# Patient Record
Sex: Male | Born: 1975 | Race: Black or African American | Hispanic: No | Marital: Single | Smoking: Former smoker
Health system: Southern US, Community
[De-identification: ages and names within clinical notes are randomized; demographics above are authoritative.]

## PROBLEM LIST (undated history)

## (undated) DIAGNOSIS — J45909 Unspecified asthma, uncomplicated: Secondary | ICD-10-CM

## (undated) HISTORY — PX: HERNIA REPAIR: SHX51

---

## 1998-02-27 ENCOUNTER — Inpatient Hospital Stay (HOSPITAL_COMMUNITY): Admission: EM | Admit: 1998-02-27 | Discharge: 1998-02-28 | Payer: Self-pay | Admitting: Emergency Medicine

## 1999-02-03 ENCOUNTER — Encounter: Payer: Self-pay | Admitting: Emergency Medicine

## 1999-02-03 ENCOUNTER — Emergency Department (HOSPITAL_COMMUNITY): Admission: EM | Admit: 1999-02-03 | Discharge: 1999-02-03 | Payer: Self-pay | Admitting: Emergency Medicine

## 2001-06-03 ENCOUNTER — Emergency Department (HOSPITAL_COMMUNITY): Admission: EM | Admit: 2001-06-03 | Discharge: 2001-06-03 | Payer: Self-pay | Admitting: Emergency Medicine

## 2002-02-25 ENCOUNTER — Emergency Department (HOSPITAL_COMMUNITY): Admission: EM | Admit: 2002-02-25 | Discharge: 2002-02-25 | Payer: Self-pay | Admitting: Emergency Medicine

## 2002-02-25 ENCOUNTER — Encounter: Payer: Self-pay | Admitting: Emergency Medicine

## 2002-03-02 ENCOUNTER — Emergency Department (HOSPITAL_COMMUNITY): Admission: EM | Admit: 2002-03-02 | Discharge: 2002-03-02 | Payer: Self-pay

## 2003-08-29 ENCOUNTER — Emergency Department (HOSPITAL_COMMUNITY): Admission: EM | Admit: 2003-08-29 | Discharge: 2003-08-29 | Payer: Self-pay | Admitting: Emergency Medicine

## 2006-08-05 ENCOUNTER — Emergency Department (HOSPITAL_COMMUNITY): Admission: EM | Admit: 2006-08-05 | Discharge: 2006-08-06 | Payer: Self-pay | Admitting: *Deleted

## 2006-10-06 ENCOUNTER — Emergency Department (HOSPITAL_COMMUNITY): Admission: EM | Admit: 2006-10-06 | Discharge: 2006-10-06 | Payer: Self-pay | Admitting: Emergency Medicine

## 2007-11-17 ENCOUNTER — Emergency Department (HOSPITAL_COMMUNITY): Admission: EM | Admit: 2007-11-17 | Discharge: 2007-11-17 | Payer: Self-pay | Admitting: Family Medicine

## 2007-12-01 ENCOUNTER — Emergency Department (HOSPITAL_COMMUNITY): Admission: EM | Admit: 2007-12-01 | Discharge: 2007-12-01 | Payer: Self-pay | Admitting: Family Medicine

## 2008-01-30 ENCOUNTER — Emergency Department (HOSPITAL_COMMUNITY): Admission: EM | Admit: 2008-01-30 | Discharge: 2008-01-30 | Payer: Self-pay | Admitting: Family Medicine

## 2008-05-03 ENCOUNTER — Emergency Department (HOSPITAL_COMMUNITY): Admission: EM | Admit: 2008-05-03 | Discharge: 2008-05-03 | Payer: Self-pay | Admitting: Emergency Medicine

## 2008-10-13 ENCOUNTER — Ambulatory Visit: Payer: Self-pay | Admitting: Pulmonary Disease

## 2008-10-13 ENCOUNTER — Inpatient Hospital Stay (HOSPITAL_COMMUNITY): Admission: EM | Admit: 2008-10-13 | Discharge: 2008-10-17 | Payer: Self-pay | Admitting: *Deleted

## 2008-10-16 ENCOUNTER — Encounter (INDEPENDENT_AMBULATORY_CARE_PROVIDER_SITE_OTHER): Payer: Self-pay | Admitting: Pulmonary Disease

## 2008-10-29 ENCOUNTER — Telehealth: Payer: Self-pay | Admitting: Emergency Medicine

## 2009-07-04 ENCOUNTER — Emergency Department (HOSPITAL_COMMUNITY): Admission: EM | Admit: 2009-07-04 | Discharge: 2009-07-04 | Payer: Self-pay | Admitting: Family Medicine

## 2009-11-20 IMAGING — CR DG CHEST 1V PORT
1 series · 1 of 1 positions shown · non-contrast
Comparison: 10/13/2008

CLINICAL DATA: .  Bronchoscopy

PORTABLE CHEST - 1 VIEW

[view not recorded]
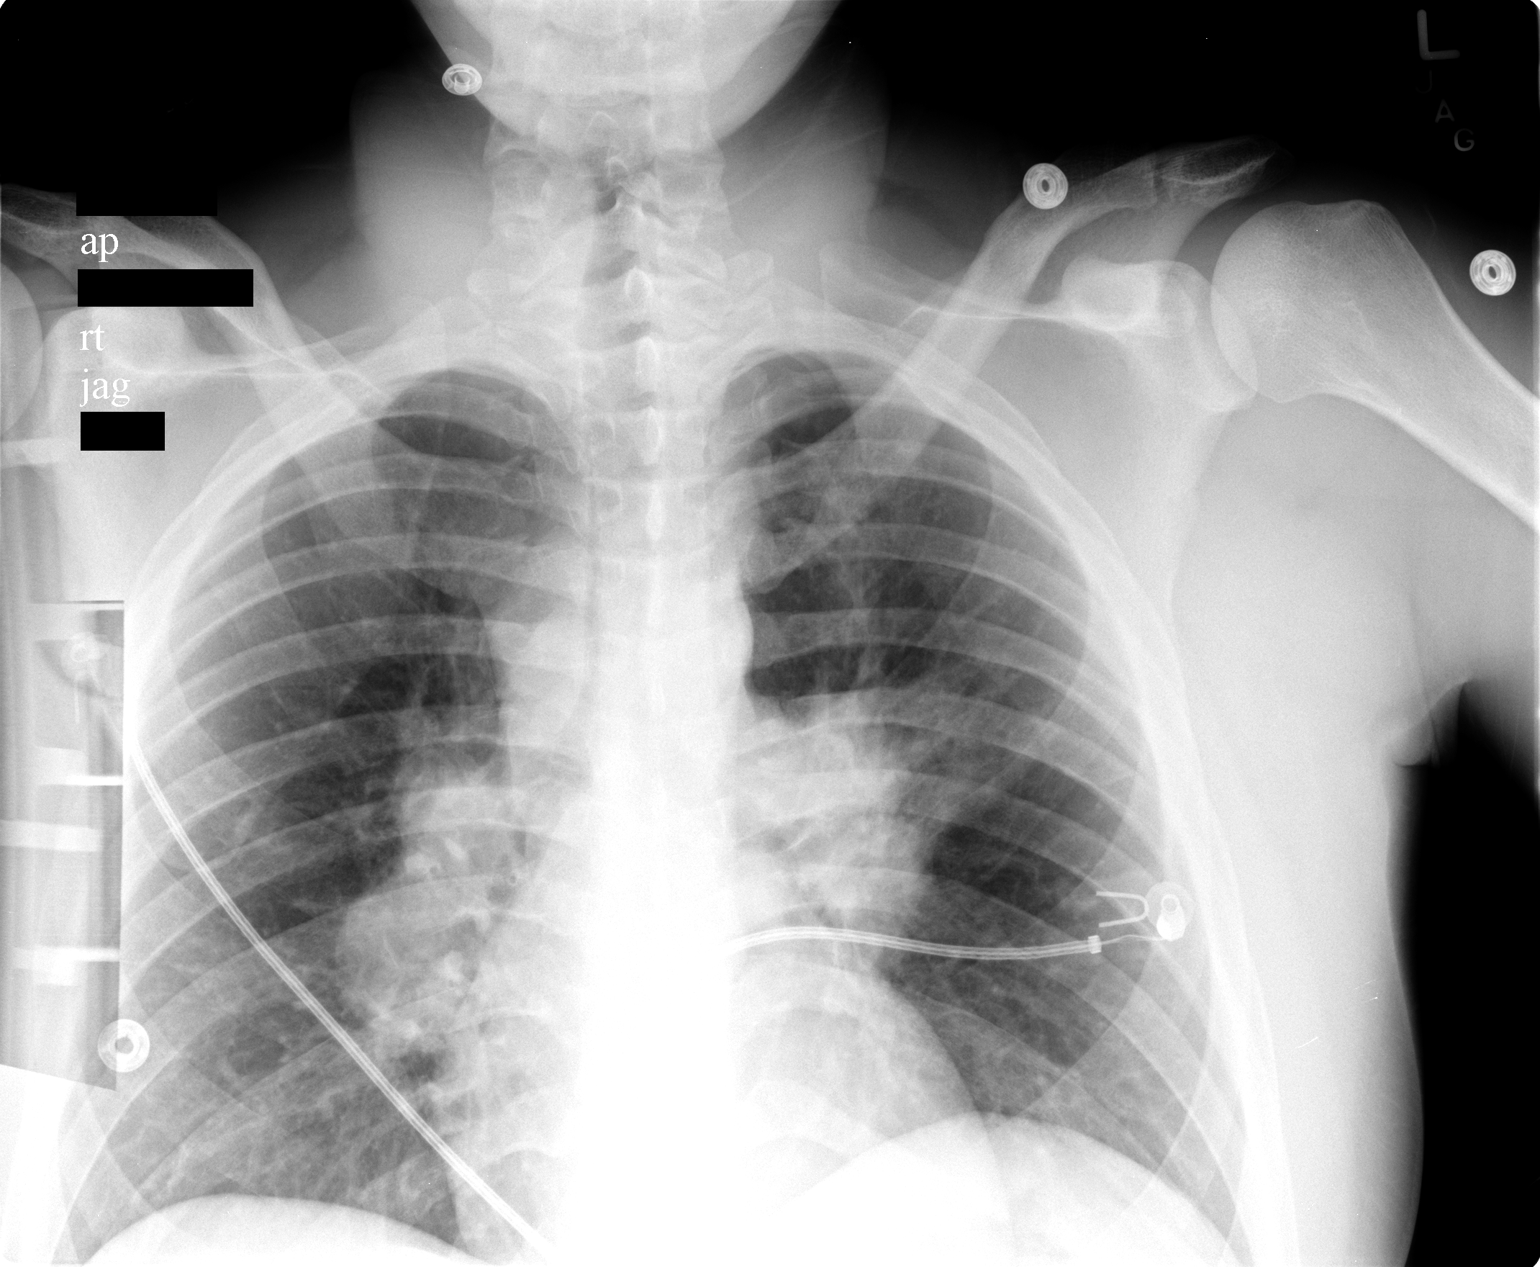

[1 of 1 positions shown; findings below may reference images not displayed]

FINDINGS: No pneumothorax or pneumomediastinum.  An artifact
projects over the right lateral chest.

Stable mediastinal adenopathy.  Patchy airspace densities less
obvious when compared to the prior PA and lateral study.
IMPRESSION: 1.  No pneumothorax or pneumomediastinum post bronchoscopy.
2.  No new findings.

## 2011-01-07 LAB — GC/CHLAMYDIA PROBE AMP, GENITAL
Chlamydia, DNA Probe: NEGATIVE
GC Probe Amp, Genital: NEGATIVE

## 2011-01-16 ENCOUNTER — Inpatient Hospital Stay (INDEPENDENT_AMBULATORY_CARE_PROVIDER_SITE_OTHER)
Admission: RE | Admit: 2011-01-16 | Discharge: 2011-01-16 | Disposition: A | Discharge status: Home / Self Care | Payer: PRIVATE HEALTH INSURANCE | Source: Ambulatory Visit | Attending: Family Medicine | Admitting: Family Medicine

## 2011-01-16 DIAGNOSIS — J309 Allergic rhinitis, unspecified: Secondary | ICD-10-CM

## 2011-01-18 LAB — CBC
HCT: 36.9 % — ABNORMAL LOW (ref 39.0–52.0)
Hemoglobin: 12.1 g/dL — ABNORMAL LOW (ref 13.0–17.0)
Hemoglobin: 12.7 g/dL — ABNORMAL LOW (ref 13.0–17.0)
MCHC: 32.6 g/dL (ref 30.0–36.0)
MCHC: 32.9 g/dL (ref 30.0–36.0)
MCV: 84.1 fL (ref 78.0–100.0)
MCV: 84.7 fL (ref 78.0–100.0)
MCV: 84.8 fL (ref 78.0–100.0)
Platelets: 330 10*3/uL (ref 150–400)
Platelets: 354 10*3/uL (ref 150–400)
RBC: 4.35 MIL/uL (ref 4.22–5.81)
RDW: 12.3 % (ref 11.5–15.5)
RDW: 12.4 % (ref 11.5–15.5)
RDW: 12.4 % (ref 11.5–15.5)
RDW: 12.5 % (ref 11.5–15.5)
WBC: 6.7 10*3/uL (ref 4.0–10.5)

## 2011-01-18 LAB — ANGIOTENSIN CONVERTING ENZYME: Angiotensin-Converting Enzyme: 69 U/L — ABNORMAL HIGH (ref 9–67)

## 2011-01-18 LAB — URINE CULTURE
Colony Count: NO GROWTH
Culture: NO GROWTH

## 2011-01-18 LAB — COMPREHENSIVE METABOLIC PANEL
ALT: 33 U/L (ref 0–53)
AST: 32 U/L (ref 0–37)
Albumin: 3 g/dL — ABNORMAL LOW (ref 3.5–5.2)
Albumin: 3.2 g/dL — ABNORMAL LOW (ref 3.5–5.2)
Alkaline Phosphatase: 67 U/L (ref 39–117)
Calcium: 9.2 mg/dL (ref 8.4–10.5)
Chloride: 98 mEq/L (ref 96–112)
Creatinine, Ser: 1.08 mg/dL (ref 0.4–1.5)
GFR calc Af Amer: >60 mL/min (ref >60)
Glucose, Bld: 108 mg/dL — ABNORMAL HIGH (ref 70–99)
Potassium: 4.1 mEq/L (ref 3.5–5.1)
Sodium: 135 mEq/L (ref 135–145)
Total Bilirubin: 0.6 mg/dL (ref 0.3–1.2)
Total Protein: 7.9 g/dL (ref 6.0–8.3)
Total Protein: 8 g/dL (ref 6.0–8.3)

## 2011-01-18 LAB — DIFFERENTIAL
Basophils Absolute: 0 10*3/uL (ref 0.0–0.1)
Basophils Relative: 0 % (ref 0–1)
Basophils Relative: 0 % (ref 0–1)
Eosinophils Absolute: 0.1 10*3/uL (ref 0.0–0.7)
Eosinophils Absolute: 0.2 10*3/uL (ref 0.0–0.7)
Eosinophils Relative: 2 % (ref 0–5)
Monocytes Absolute: 1.2 10*3/uL — ABNORMAL HIGH (ref 0.1–1.0)
Monocytes Absolute: 1.5 10*3/uL — ABNORMAL HIGH (ref 0.1–1.0)
Monocytes Relative: 14 % — ABNORMAL HIGH (ref 3–12)
Neutro Abs: 4.7 10*3/uL (ref 1.7–7.7)
Neutrophils Relative %: 60 % (ref 43–77)

## 2011-01-18 LAB — CULTURE, RESPIRATORY W GRAM STAIN

## 2011-01-18 LAB — FUNGUS CULTURE W SMEAR

## 2011-01-18 LAB — HIGH SENSITIVITY CRP: CRP, High Sensitivity: 135.8 mg/L — ABNORMAL HIGH

## 2011-01-18 LAB — URINALYSIS, ROUTINE W REFLEX MICROSCOPIC
Glucose, UA: NEGATIVE mg/dL
Protein, ur: NEGATIVE mg/dL
pH: 6.5 (ref 5.0–8.0)

## 2011-01-18 LAB — AFB CULTURE WITH SMEAR (NOT AT ARMC): Acid Fast Smear: NONE SEEN

## 2011-01-18 LAB — ANTI-NEUTROPHIL ANTIBODY

## 2011-01-18 LAB — CULTURE, BLOOD (ROUTINE X 2)
Culture: NO GROWTH
Culture: NO GROWTH

## 2011-01-18 LAB — ANA: Anti Nuclear Antibody(ANA): POSITIVE — AB

## 2011-01-18 LAB — RHEUMATOID FACTOR: Rhuematoid fact SerPl-aCnc: <20 IU/mL (ref 0–20)

## 2011-01-18 LAB — PROTIME-INR: Prothrombin Time: 16 seconds — ABNORMAL HIGH (ref 11.6–15.2)

## 2011-01-18 LAB — ANTI-NUCLEAR AB-TITER (ANA TITER): ANA Titer 1: NEGATIVE

## 2011-02-16 NOTE — Consult Note (Signed)
NAME:  Seth, Cook NO.:  [id number]   MEDICAL RECORD NO.:  [id number]          PATIENT TYPE:  INP   LOCATION:  5509                         FACILITY:  [facility]   PHYSICIAN:  Felipa Evener, MD  DATE OF BIRTH:  [birth year]   DATE OF CONSULTATION:  DATE OF DISCHARGE:                                 CONSULTATION   HISTORY OF PRESENT ILLNESS:  The patient is a 35 year old male with no  significant past medical history presents today to the hospital with the  history of dry cough, some fatigue, and some weight loss as well as some  chills and night sweats, but no fevers.  As per the patient, no sputum  production, no nasal discharge, no rash, no sick contacts, and no recent  travel.   PAST MEDICAL HISTORY:  Significant for asthma as a child, which has not  been causing any further problems.   PAST SURGICAL HISTORY:  None.   FAMILY HISTORY:  Father died of myocardial infarction, recently at age  36.  Mother is alive and well.  Grandmother of lung cancer, was a  smoking.   MEDICATIONS:  Singulair 10 mg daily and albuterol p.r.n. basis.   ALLERGIES:  No known drug allergies.   SOCIAL HISTORY:  He smokes approximately 1 pack per day for all the past  5 years and occasional marijuana smoking, but it has increased since his  father died, and he has been off of it for the past 6 months.  No  significant occupation history and no significant environment  exposures  as per the patient.   REVIEW OF SYSTEMS:  A 12-point review of systems was negative other than  mentioned in the HPI.   PHYSICAL EXAMINATION:  GENERAL:  A well appearing male who is sitting  comfortable in bed and in no acute distress.  VITAL SIGNS:  He has temperature of 98.6, heart rate is 84, respirations  20, blood pressure 141/70, and saturation 100% on room air.  HEENT:  Normocephalic and atraumatic.  Pupils were equal, round, and  reactive to light.  Extraocular movements are intact.  The  patient's  oral and nasal mucosa within normal limits.  NECK:  No thyromegaly or lymphadenopathy.  No hepatojugular reflux.  HEART:  Regular rate and rhythm.  Normal S1 and S2.  No murmurs, rubs,  or gallops appreciated.  LUNGS:  Clear to auscultation bilaterally.  ABDOMEN:  Soft, nontender, and nondistended.  Positive bowel sounds.  EXTREMITIES:  No edema.  No tenderness appreciated.  NEUROLOGIC:  Grossly intact.   LABORATORY STUDIES:  1. Laboratory studies were all reviewed.  Significant for CBC with a      white blood cell count without differential of 6.7, hemoglobin is      12.8, hematocrit 39, and platelet count of 354.  Lipase is 21,      total LDH is 165, sodium 136, potassium 3.9, chloride is 98, CO2 is      27, glucose is 129, BUN is 9, creatinine is 1.0, AST of 32, and ALT      of 36.  ESR  is 90, PT of 40, and INR of 1.2.  2. CT of the chest reviewed myself showed mediastinal lymphadenopathy.      There are of significant sizes and minimal pulmonary infiltrates.   ASSESSMENT AND PLAN:  The patient is 35 year old male with fairly benign  past medical history presented with diffuse mediastinal lymphadenopathy,  pulmonary involvement.  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  I do not think to consider,  however,  given the occupational history significantly unlikely.  I,  therefore, at this point, would continue giving patient  respiratory  isolation and AFB x3.  As ordered, the patient is unable to expectorate,  we will do bronchoscopy on Wednesday or sooner if we get time for the  patient to have his bronchoscopy done.  We would continue him an Avelox  for total 5 days.  Bronchoscopy 1 p.m. on Wednesday.  We will check his  CRP, ANA, and rheumatoid factor, c-ANCA and p-ANCA level.  We will check  CBC with differential for evaluation of eosinophilia and we will  bronchoscope the patient as stated.  The patient also was  provided  the  information and all questions answered.      Felipa Evener, MD  Electronically Signed     WJY/MEDQ  D:  10/14/2008  T:  10/15/2008  Job:  [id number]

## 2011-02-16 NOTE — Op Note (Signed)
[name], Seth Cook              ACCOUNT NO.:  [id number]   MEDICAL RECORD NO.:  [id number]           PATIENT TYPE:   LOCATION:                                 FACILITY:   PHYSICIAN:  Felipa Evener, MD  DATE OF BIRTH:  January 30, 1976   DATE OF PROCEDURE:  DATE OF DISCHARGE:                               OPERATIVE REPORT   PROCEDURES PERFORMED:  Fiberoptic bronchoscopy, bronchioalveolar lavage,  transbronchial biopsies, and Wang needle biopsy.   INDICATION FOR PROCEDURE:  Mediastinal lymphadenopathy and left lower  lobe superior segment pulmonary infiltrate.  After explaining risks and  benefits of procedure to the patient including hemithorax, pneumothorax,  vocal cord trauma, drug reaction, the patient signed informed consent.   PROCEDURE PERFORMED:  Bronchoscopy.   After giving the patient 200 mcg IV fentanyl and 3 mg IV Versed, topical  lidocaine , the bronchoscope was passed through the patient's right  trachea to the cord.  1% lidocaine was instilled in the cord, cord  appeared to be functional within normal limits, and 1% lidocaine was  instilled in the carina and  bronchoscope was passed into the right  mainstem bronchus, right upper lobe, middle lobe, and lower lobe all  appeared to be within normal limits.  A Wang needle biopsy was done at  the right upper lobe.  The subsegmental carina appeared to be splayed  open though the mid carina was within normal limits.  Bronchoscope was  withdrawn passing through the left mainstem bronchus with brushings of  the left lower lobe, a BAL was performed as well as 5 transbronchial  biopsies.  A Wang needle biopsy was performed at the main carina.  Hemostasis was achieved.  Bronchoscope was withdrawn to the carina  through the cord, and outside.  The patient appeared to tolerate the  procedure well, no complications.  Samples will be sent forbacteria,  fungus, as well as cell count with differential, cytology, and  pathologic  evaluation.      Felipa Evener, MD  Electronically Signed     WJY/MEDQ  D:  10/16/2008  T:  10/17/2008  Job:  [id number]

## 2011-02-16 NOTE — H&P (Signed)
NAME:  Seth Cook, Seth Cook              ACCOUNT NO.:  [id number]   MEDICAL RECORD NO.:  [id number]          PATIENT TYPE:  INP   LOCATION:  1825                         FACILITY:  [facility]   PHYSICIAN:  Wilson Singer, M.D.DATE OF BIRTH:  11/27/1975   DATE OF ADMISSION:  10/13/2008  DATE OF DISCHARGE:                              HISTORY & PHYSICAL   This is a very pleasant 35 year old African American gentleman who gives  a 53-month history of a dry cough associated with some fatigue and some  weight loss, although this is not unusual for him.  He does not describe  any significant night sweats.  He was referred to the Hospitalist  Service because of a very abnormal chest x-ray.   PAST MEDICAL HISTORY:  Significant for asthma, which is episodic and not  a major problem.   SURGERIES:  No operations.   FAMILY HISTORY:  His father died of a myocardial infarction recently at  the age of 26.   MEDICATIONS:  Singulair 10 mg daily, albuterol p.r.n. inhaler   ALLERGIES:  No known drug allergies.   REVIEW OF SYSTEMS:  Apart from the symptoms mentioned above, there are  no other symptoms that are referable to [place].  All systems reviewed.  In  particular, there is no hemoptysis and itching.  He does describe that  he has been smoking marijuana for the last 1 month and his symptoms  started around the time he started smoking marijuana.   PHYSICAL EXAMINATION:  VITAL SIGNS:  Temperature 98.1, blood pressure  120/72, pulse 80, respiratory rate 12-14, saturation 98% on room air.  GENERAL:  He looks systemically well.  He is not in any respiratory  distress.  CARDIOVASCULAR:  Heart sounds are present and normal.  RESPIRATORY:  Lung fields clinically clear with no wheezing or crackles.  ABDOMEN:  Soft and nontender with no hepatosplenomegaly.  NEUROLOGIC:  He is alert and oriented without any focal neurological  signs.  GENERAL:  There is no lymphadenopathy in the supraclavicular or axillary  areas.   INVESTIGATIONS:  Chest x-ray is very abnormal with bilateral hilar and  mediastinal lymphadenopathy and new patchy bilateral upper lobe  infiltrates.  Hemoglobin 12.7, white blood cell count 8.6, platelets  330.  Urinalysis is negative.  Sodium 135, potassium 4.1, chloride 100,  bicarbonate 27, glucose 108, BUN 14, creatinine 1.17, albumin 3.2,  lipase 21.   IMPRESSION:  1. Pulmonary infiltrates and bilateral hilar lymphadenopathy.  The      differential diagnosis is sarcoidosis, atypical pneumonia.  He      possibly could have lymphoma or even tuberculosis, but the last      differential diagnosis is very unlikely based on his presentation.  2. Asthma.  3. One-month history of smoking marijuana, which may be correlated      with his symptoms and his infiltrates, although I am not clear      about this.   PLAN:  1. Admit.  2. Empirical antibiotics.  3. Sputum for AFB and culture.  4. ACE levels and a CT scan of the chest.  5. Consider Pulmonary  consultation.   Further recommendations will depend on the patient's hospital progress.      Wilson Singer, M.D.  Electronically Signed     NCG/MEDQ  D:  10/13/2008  T:  10/13/2008  Job:  [id number]

## 2011-02-16 NOTE — Discharge Summary (Signed)
NAMEBRADEY, Seth Cook              ACCOUNT NO.:  [id number]   MEDICAL RECORD NO.:  [id number]          PATIENT TYPE:  INP   LOCATION:  5509                         FACILITY:  [facility]   PHYSICIAN:  Richarda Overlie, MD       DATE OF BIRTH:  21-Jul-1976   DATE OF ADMISSION:  10/13/2008  DATE OF DISCHARGE:  10/17/2008                               DISCHARGE SUMMARY   DISCHARGE DIAGNOSES:  1. Bilateral pulmonary infiltrates with hilar adenopathy.  2. History of asthma.   CONSULTATIONS:  Felipa Evener, M.D.   SUBJECTIVE:  This is a 35 year old male who presents with a history of  asthma, dry cough, fatigue, weight loss, chills, night sweats but no  fevers.  The patient was found to have bilaterally hilar adenopathy, new  bilateral infiltrates in the upper lobes with a broad differential  diagnosis of sarcoidosis, infectious granulomatous disease or lymphoma.  The patient subsequently had a CT scan of the chest with contrast that  showed bulky mediastinal and symmetric bilateral hilar lymphadenopathy.  The patient was evaluated by pulmonology, Dr. Molli Knock, and agreed with  the above differential diagnosis.  He did a bronchoscopy with a  bronchoalveolar lavage.  The patient was placed on respiratory isolation  and had an AFB done x3.  The patient was found to have an elevated CRP  level of 135, a negative rheumatoid factor, a positive antibody for  antinuclear antibody.  The patient also had fungal cultures sent.  Fungal cultures were pending at the time.  The patient's AFB stain is  also pending at this time.  The patient's blood cultures remain negative  to day.  The patient did not have any evidence of any cardiopulmonary  compromise and continued to do well on room air.  He was started on  empiric treatment with azithromycin and ceftriaxone and was subsequently  switched to Levaquin.  Smoking cessation counseling was advised.  The  patient will be discharged if his AFB is negative.   DISCHARGE MEDICATIONS:  1. Singulair 10 mg p.o. daily.  2. Albuterol M.D.I. two puffs q.4h. p.r.n.  3. Levaquin 500 mg p.o. daily for 3 days.  4. Robitussin with codeine 10 mL p.o. q.4h. p.r.n.  5. Men's One-A-Day Vitamin one tablet p.o. daily.  6. Tylenol p.r.n.   FOLLOW UP CONCERNS:  Follow up with Dr. Molli Knock.  The patient provided  with phone number, [phone], with phone number [phone], in 1-2 weeks.   The patient was ambulated prior to discharge without any evidence of  hypoxia, respiratory compromise.  He has been instructed to go back to  work on October 21, 2008.      Richarda Overlie, MD  Electronically Signed     NA/MEDQ  D:  10/17/2008  T:  10/17/2008  Job:  [id number]

## 2011-06-25 LAB — GC/CHLAMYDIA PROBE AMP, GENITAL: Chlamydia, DNA Probe: POSITIVE — AB

## 2014-03-07 ENCOUNTER — Emergency Department (HOSPITAL_COMMUNITY)
Admission: EM | Admit: 2014-03-07 | Discharge: 2014-03-07 | Disposition: A | Discharge status: Home / Self Care | Payer: BC Managed Care – PPO | Source: Home / Self Care | Attending: Family Medicine | Admitting: Family Medicine

## 2014-03-07 ENCOUNTER — Encounter (HOSPITAL_COMMUNITY): Payer: Self-pay | Admitting: Emergency Medicine

## 2014-03-07 ENCOUNTER — Other Ambulatory Visit (HOSPITAL_COMMUNITY)
Admission: RE | Admit: 2014-03-07 | Discharge: 2014-03-07 | Disposition: A | Discharge status: Home / Self Care | Payer: BC Managed Care – PPO | Source: Ambulatory Visit | Attending: Family Medicine | Admitting: Family Medicine

## 2014-03-07 DIAGNOSIS — Z113 Encounter for screening for infections with a predominantly sexual mode of transmission: Secondary | ICD-10-CM | POA: Insufficient documentation

## 2014-03-07 DIAGNOSIS — R590 Localized enlarged lymph nodes: Secondary | ICD-10-CM

## 2014-03-07 DIAGNOSIS — R599 Enlarged lymph nodes, unspecified: Secondary | ICD-10-CM

## 2014-03-07 DIAGNOSIS — Z202 Contact with and (suspected) exposure to infections with a predominantly sexual mode of transmission: Secondary | ICD-10-CM

## 2014-03-07 LAB — POCT URINALYSIS DIP (DEVICE)
Bilirubin Urine: NEGATIVE
Glucose, UA: NEGATIVE mg/dL
Hgb urine dipstick: NEGATIVE
Ketones, ur: NEGATIVE mg/dL
LEUKOCYTES UA: NEGATIVE
Nitrite: NEGATIVE
PROTEIN: NEGATIVE mg/dL
Specific Gravity, Urine: 1.025 (ref 1.005–1.030)
UROBILINOGEN UA: 0.2 mg/dL (ref 0.0–1.0)
pH: 6 (ref 5.0–8.0)

## 2014-03-07 MED ORDER — AZITHROMYCIN 250 MG PO TABS
ORAL_TABLET | ORAL | Status: AC
  Filled 2014-03-07: qty 4

## 2014-03-07 MED ORDER — LIDOCAINE HCL (PF) 1 % IJ SOLN
INTRAMUSCULAR | Status: AC
  Filled 2014-03-07: qty 5

## 2014-03-07 MED ORDER — CEFTRIAXONE SODIUM 250 MG IJ SOLR
250.0000 mg | Freq: Once | INTRAMUSCULAR | Status: AC
  Administered 2014-03-07: 250 mg via INTRAMUSCULAR

## 2014-03-07 MED ORDER — CEFTRIAXONE SODIUM 250 MG IJ SOLR
INTRAMUSCULAR | Status: AC
  Filled 2014-03-07: qty 250

## 2014-03-07 MED ORDER — AZITHROMYCIN 250 MG PO TABS: 1000.0000 mg | ORAL_TABLET | Freq: Every day | ORAL | Status: DC

## 2014-03-07 NOTE — ED Provider Notes (Signed)
CSN: [id number]     Arrival date & time 03/07/14  0802 History   First MD Initiated Contact with Patient 03/07/14 0831     Chief Complaint  Patient presents with  . Exposure to STD  . Urinary Tract Infection   (Consider location/radiation/quality/duration/timing/severity/associated sxs/prior Treatment) HPI Comments: 38 year old male presents for evaluation after possible exposure to STD. He had a new sexual partner earlier in the week and he wants to get checked before his girlfriend comes home. His only symptom is that he has some soreness in his left groin. He denies any testicular pain or penile discharge. No other symptoms. No injuries. No genital lesions  Patient is a 38 y.o. male presenting with STD exposure and urinary tract infection.  Exposure to STD  Urinary Tract Infection    History reviewed. No pertinent past medical history. History reviewed. No pertinent past surgical history. History reviewed. No pertinent family history. History  Substance Use Topics  . Smoking status: Never Smoker   . Smokeless tobacco: Not on file  . Alcohol Use: No    Review of Systems  Genitourinary:       Soreness in the left groin  All other systems reviewed and are negative.   Allergies  Review of patient's allergies indicates no known allergies.  Home Medications   Prior to Admission medications   Not on File   BP 158/88  Pulse 65  Temp(Src) 98.1 F (36.7 C) (Oral)  Resp 12  SpO2 98% Physical Exam  Nursing note and vitals reviewed. Constitutional: He is oriented to person, place, and time. He appears well-developed and well-nourished. No distress.  HENT:  Head: Normocephalic.  Pulmonary/Chest: Effort normal. No respiratory distress.  Lymphadenopathy:       Right: No inguinal adenopathy present.       Left: Inguinal adenopathy present.  Neurological: He is alert and oriented to person, place, and time. Coordination normal.  Skin: Skin is warm and dry. No rash noted. He  is not diaphoretic.  Psychiatric: He has a normal mood and affect. Judgment normal.    ED Course  Procedures (including critical care time) Labs Review Labs Reviewed  RPR  HIV ANTIBODY (ROUTINE TESTING)  POCT URINALYSIS DIP (DEVICE)  URINE CYTOLOGY ANCILLARY ONLY    Imaging Review No results found.   MDM   1. Inguinal lymphadenopathy   2. Possible exposure to STD    Possible STD exposure with new left inguinal lymphadenopathy, we'll go ahead injury for GC and Chlamydia. Cytology sent. Declines HIV and RPR. Followup as needed if no improvement. Will call with positive results   Meds ordered this encounter  Medications  . cefTRIAXone (ROCEPHIN) injection 250 mg    Sig:   . azithromycin (ZITHROMAX) tablet 1,000 mg    Sig:        Seth Good, PA-C 03/07/14 0841  Seth Good, PA-C 03/07/14 [phone]

## 2014-03-07 NOTE — ED Notes (Signed)
Reports exposure to std.  Having no symptoms.   Also would like to be checked for uti.    Denies fever and any other symptoms

## 2014-03-08 NOTE — ED Provider Notes (Signed)
Medical screening examination/treatment/procedure(s) were performed by a resident physician or non-physician practitioner and as the supervising physician I was immediately available for consultation/collaboration.  Clementeen Graham, MD   Rodolph Bong, MD 03/08/14 [phone]

## 2014-03-11 ENCOUNTER — Telehealth (HOSPITAL_COMMUNITY): Payer: Self-pay | Admitting: *Deleted

## 2014-03-11 NOTE — ED Notes (Signed)
Pt. called for his lab results.  Pt. verified x 2 and given negative (GC/chlamydia and Trich) results.

## 2014-04-20 ENCOUNTER — Emergency Department (HOSPITAL_COMMUNITY)
Admission: EM | Admit: 2014-04-20 | Discharge: 2014-04-20 | Discharge status: Absent without leave | Payer: BC Managed Care – PPO | Source: Home / Self Care

## 2014-07-31 ENCOUNTER — Encounter (HOSPITAL_BASED_OUTPATIENT_CLINIC_OR_DEPARTMENT_OTHER): Payer: Self-pay | Admitting: Emergency Medicine

## 2014-07-31 ENCOUNTER — Emergency Department (HOSPITAL_BASED_OUTPATIENT_CLINIC_OR_DEPARTMENT_OTHER)
Admission: EM | Admit: 2014-07-31 | Discharge: 2014-07-31 | Disposition: A | Discharge status: Home / Self Care | Payer: BC Managed Care – PPO | Attending: Emergency Medicine | Admitting: Emergency Medicine

## 2014-07-31 DIAGNOSIS — Z8739 Personal history of other diseases of the musculoskeletal system and connective tissue: Secondary | ICD-10-CM | POA: Diagnosis not present

## 2014-07-31 DIAGNOSIS — J0191 Acute recurrent sinusitis, unspecified: Secondary | ICD-10-CM | POA: Diagnosis present

## 2014-07-31 DIAGNOSIS — J069 Acute upper respiratory infection, unspecified: Secondary | ICD-10-CM | POA: Diagnosis not present

## 2014-07-31 LAB — RAPID STREP SCREEN (MED CTR MEBANE ONLY): STREPTOCOCCUS, GROUP A SCREEN (DIRECT): NEGATIVE

## 2014-07-31 MED ORDER — DEXAMETHASONE 4 MG PO TABS
10.0000 mg | ORAL_TABLET | Freq: Once | ORAL | Status: AC
  Administered 2014-07-31: 10 mg via ORAL

## 2014-07-31 MED ORDER — DEXAMETHASONE 4 MG PO TABS
ORAL_TABLET | ORAL | Status: AC
  Filled 2014-07-31: qty 3

## 2014-07-31 NOTE — ED Provider Notes (Signed)
CSN: [id number]     Arrival date & time 07/31/14  1059 History   First MD Initiated Contact with Patient 07/31/14 1117     Chief Complaint  Patient presents with  . Recurrent Sinusitis     (Consider location/radiation/quality/duration/timing/severity/associated sxs/prior Treatment) Patient is a 38 y.o. male presenting with pharyngitis.  Sore Throat This is a new problem. The current episode started 12 to 24 hours ago. The problem occurs constantly. The problem has not changed since onset.Pertinent negatives include no headaches and no shortness of breath. Nothing aggravates the symptoms. Nothing relieves the symptoms. He has tried nothing for the symptoms.    Past Medical History  Diagnosis Date  . Arthritis    History reviewed. No pertinent past surgical history. No family history on file. History  Substance Use Topics  . Smoking status: Never Smoker   . Smokeless tobacco: Not on file  . Alcohol Use: No    Review of Systems  Respiratory: Negative for shortness of breath.   Neurological: Negative for headaches.  All other systems reviewed and are negative.     Allergies  Review of patient's allergies indicates no known allergies.  Home Medications   Prior to Admission medications   Medication Sig Start Date End Date Taking? Authorizing Provider  albuterol (PROVENTIL) (5 MG/ML) 0.5% nebulizer solution Take 2.5 mg by nebulization every 6 (six) hours as needed for wheezing or shortness of breath.   Yes Historical Provider, MD   BP 169/95  Pulse 66  Temp(Src) 98.9 F (37.2 C) (Oral)  Resp 16  Ht 6' (1.829 m)  Wt 250 lb (113.399 kg)  BMI 33.90 kg/m2  SpO2 100% Physical Exam  Vitals reviewed. Constitutional: He is oriented to person, place, and time. He appears well-developed and well-nourished.  HENT:  Head: Normocephalic and atraumatic.  Mouth/Throat: Posterior oropharyngeal erythema present. No posterior oropharyngeal edema.  No sinus tenderness  Eyes:  Conjunctivae and EOM are normal.  Neck: Normal range of motion. Neck supple.  Cardiovascular: Normal rate, regular rhythm and normal heart sounds.   Pulmonary/Chest: Effort normal and breath sounds normal. No respiratory distress.  Abdominal: He exhibits no distension. There is no tenderness. There is no rebound and no guarding.  Musculoskeletal: Normal range of motion.  Neurological: He is alert and oriented to person, place, and time.  Skin: Skin is warm and dry.    ED Course  Procedures (including critical care time) Labs Review Labs Reviewed  RAPID STREP SCREEN  CULTURE, GROUP A STREP    Imaging Review No results found.   EKG Interpretation None      MDM   Final diagnoses:  Viral upper respiratory illness    38 y.o. male with pertinent PMH of recurrent chronic sinus problems presents with rhinorrhea, sinus pressure and headache x 1 day.  Has had small amount of cough and sore throat.  On arrival vitals and physical exam as above with posterior oropharyngeal erythema.  Patient well-appearing, has no intolerance of by mouth intake. Strep screen negative. In combination with his story do not feel strep pharyngitis is likely etiology. Suspect viral process. Patient discharged home in stable condition with standard return precautions.  1. Viral upper respiratory illness         Mirian MoMatthew Gianno Volner, MD 07/31/14 [phone]

## 2014-07-31 NOTE — Discharge Instructions (Signed)
Upper Respiratory Infection, Adult °An upper respiratory infection (URI) is also sometimes known as the common cold. The upper respiratory tract includes the nose, sinuses, throat, trachea, and bronchi. Bronchi are the airways leading to the lungs. Most people improve within 1 week, but symptoms can last up to 2 weeks. A residual cough may last even longer.  °CAUSES °Many different viruses can infect the tissues lining the upper respiratory tract. The tissues become irritated and inflamed and often become very moist. Mucus production is also common. A cold is contagious. You can easily spread the virus to others by oral contact. This includes kissing, sharing a glass, coughing, or sneezing. Touching your mouth or nose and then touching a surface, which is then touched by another person, can also spread the virus. °SYMPTOMS  °Symptoms typically develop 1 to 3 days after you come in contact with a cold virus. Symptoms vary from person to person. They may include: °· Runny nose. °· Sneezing. °· Nasal congestion. °· Sinus irritation. °· Sore throat. °· Loss of voice (laryngitis). °· Cough. °· Fatigue. °· Muscle aches. °· Loss of appetite. °· Headache. °· Low-grade fever. °DIAGNOSIS  °You might diagnose your own cold based on familiar symptoms, since most people get a cold 2 to 3 times a year. Your caregiver can confirm this based on your exam. Most importantly, your caregiver can check that your symptoms are not due to another disease such as strep throat, sinusitis, pneumonia, asthma, or epiglottitis. Blood tests, throat tests, and X-rays are not necessary to diagnose a common cold, but they may sometimes be helpful in excluding other more serious diseases. Your caregiver will decide if any further tests are required. °RISKS AND COMPLICATIONS  °You may be at risk for a more severe case of the common cold if you smoke cigarettes, have chronic heart disease (such as heart failure) or lung disease (such as asthma), or if  you have a weakened immune system. The very young and very old are also at risk for more serious infections. Bacterial sinusitis, middle ear infections, and bacterial pneumonia can complicate the common cold. The common cold can worsen asthma and chronic obstructive pulmonary disease (COPD). Sometimes, these complications can require emergency medical care and may be life-threatening. °PREVENTION  °The best way to protect against getting a cold is to practice good hygiene. Avoid oral or hand contact with people with cold symptoms. Wash your hands often if contact occurs. There is no clear evidence that vitamin C, vitamin E, echinacea, or exercise reduces the chance of developing a cold. However, it is always recommended to get plenty of rest and practice good nutrition. °TREATMENT  °Treatment is directed at relieving symptoms. There is no cure. Antibiotics are not effective, because the infection is caused by a virus, not by bacteria. Treatment may include: °· Increased fluid intake. Sports drinks offer valuable electrolytes, sugars, and fluids. °· Breathing heated mist or steam (vaporizer or shower). °· Eating chicken soup or other clear broths, and maintaining good nutrition. °· Getting plenty of rest. °· Using gargles or lozenges for comfort. °· Controlling fevers with ibuprofen or acetaminophen as directed by your caregiver. °· Increasing usage of your inhaler if you have asthma. °Zinc gel and zinc lozenges, taken in the first 24 hours of the common cold, can shorten the duration and lessen the severity of symptoms. Pain medicines may help with fever, muscle aches, and throat pain. A variety of non-prescription medicines are available to treat congestion and runny nose. Your caregiver   can make recommendations and may suggest nasal or lung inhalers for other symptoms.  °HOME CARE INSTRUCTIONS  °· Only take over-the-counter or prescription medicines for pain, discomfort, or fever as directed by your  caregiver. °· Use a warm mist humidifier or inhale steam from a shower to increase air moisture. This may keep secretions moist and make it easier to breathe. °· Drink enough water and fluids to keep your urine clear or pale yellow. °· Rest as needed. °· Return to work when your temperature has returned to normal or as your caregiver advises. You may need to stay home longer to avoid infecting others. You can also use a face mask and careful hand washing to prevent spread of the virus. °SEEK MEDICAL CARE IF:  °· After the first few days, you feel you are getting worse rather than better. °· You need your caregiver's advice about medicines to control symptoms. °· You develop chills, worsening shortness of breath, or brown or red sputum. These may be signs of pneumonia. °· You develop yellow or brown nasal discharge or pain in the face, especially when you bend forward. These may be signs of sinusitis. °· You develop a fever, swollen neck glands, pain with swallowing, or white areas in the back of your throat. These may be signs of strep throat. °SEEK IMMEDIATE MEDICAL CARE IF:  °· You have a fever. °· You develop severe or persistent headache, ear pain, sinus pain, or chest pain. °· You develop wheezing, a prolonged cough, cough up blood, or have a change in your usual mucus (if you have chronic lung disease). °· You develop sore muscles or a stiff neck. °Document Released: 03/16/2001 Document Revised: 12/13/2011 Document Reviewed: 12/26/2013 °ExitCare® Patient Information ©2015 [employer]. This information is not intended to replace advice given to you by your health care provider. Make sure you discuss any questions you have with your health care provider. ° °Emergency Department Resource Guide °1) Find a Doctor and Pay Out of Pocket °Although you won't have to find out who is covered by your insurance plan, it is a good idea to ask around and get recommendations. You will then need to call the office and see if  the doctor you have chosen will accept you as a new patient and what types of options they offer for patients who are self-pay. Some doctors offer discounts or will set up payment plans for their patients who do not have insurance, but you will need to ask so you aren't surprised when you get to your appointment. ° °2) Contact Your Local Health Department °Not all health departments have doctors that can see patients for sick visits, but many do, so it is worth a call to see if yours does. If you don't know where your local health department is, you can check in your phone book. The CDC also has a tool to help you locate your state's health department, and many state websites also have listings of all of their local health departments. ° °3) Find a Walk-in Clinic °If your illness is not likely to be very severe or complicated, you may want to try a walk in clinic. These are popping up all over the country in pharmacies, drugstores, and shopping centers. They're usually staffed by nurse practitioners or physician assistants that have been trained to treat common illnesses and complaints. They're usually fairly quick and inexpensive. However, if you have serious medical issues or chronic medical problems, these are probably not your best option. ° °  No Primary Care Doctor: °- Call [facility] at  832-8000 - they can help you locate a primary care doctor that  accepts your insurance, provides certain services, etc. °- Physician Referral Service- 1-800-533-3463 ° °Chronic Pain Problems: °Organization         Address  Phone   Notes  °[facility]  (336) 297-2271 Patients need to be referred by their primary care doctor.  ° °Medication Assistance: °Organization         Address  Phone   Notes  °[place] [facility] [address] °[place], [place] [address] (336) 641-8030 --Must be a resident of [place] °-- Must have NO insurance coverage whatsoever (no  Medicaid/ Medicare, etc.) °-- The pt. MUST have a primary care doctor that directs their care regularly and follows them in the community °  °[facility]  (866) 331-1348   °[employer]  (888) 892-1162   ° °Agencies that provide inexpensive medical care: °Organization         Address  Phone   Notes  °[facility]  (336) 832-8035   °[facility]    (336) 832-7272   °[facility] [address] °[place], [place] [address] (336) 832-4777   °[facility] [address], °[place] (336) 271-4999   °[facility]    (336) 373-0678   °[facility]    (336) 272-1050   °[facility] ° [address], [place] Phone:  (336) 832-4444, Fax:  (336) 832-4440 Hours of Operation:  9 am - 6 pm, M-F.  Also accepts Medicaid/Medicare and self-pay.  °[facility] ° [address], [place] Phone: (336) 832-3150, Fax: (336) 832-3151. Hours of Operation:  8:30 am - 5:30 pm, M-F.  Also accepts Medicaid and self-pay.  °[facility] [place] [address], [place] Phone: (336) 878-6027   °[facility] [address], [place], [place] (336)723-1848, Ext. 123 Mondays & Thursdays: 7-9 AM.  First 15 patients are seen on a first come, first serve basis. °  ° °Medicaid-accepting [place] Providers: ° °Organization         Address  Phone   Notes  °[facility] [address], [place] (336) 641-2100 Also accepts self-pay patients.  °[facility] [address], [place] ° (336) 856-9996   °[facility] [address], [place] (336) 288-8857   °[facility] [address] [place] [address], [place] (336) 299-7000   °Veita Bland [address], [place]  ° (336) 373-1557 Only accepts [place] Access Medicaid patients after they have their name applied to their card.   ° °Self-Pay (no insurance) in [place]: ° °Organization         Address  Phone   Notes  °Sickle Cell Patients, [facility] [address], [place] (336) 832-1970   °[facility] [address], [place] (336) 832-4400   °[facility] ° [address], [place] (336) 992-4800   °[facility]/Dr. Osei-Bonsu ° [address] [place] [address], [place] or [address], [place] (336) 841-8500 Phone number for both [place] and [place] locations is the same.  °[facility] [address], [place] (336) 299-0000   °  [facility] [address] [place] [address], [place] or [address] (336) 852-7530 °(336) 878-2260   °[facility] [address], [place] (336) 350-1642, phone; (336) 294-5005, fax Sees patients 1st and 3rd Saturday of every month.  Must not qualify for public or private insurance (i.e. Medicaid, Medicare, [place] Health Choice, Veterans' Benefits) • Household income should be no more than 200% of the poverty level •The clinic cannot treat you if you are pregnant or think you are pregnant • Sexually transmitted diseases are not treated at the clinic.  ° ° °Dental Care: °Organization         Address  Phone  Notes  °[facility] [address], [place] (336) 641-6152 Accepts children up to age 21 who are enrolled in Medicaid or [place] Health Choice; pregnant women with a Medicaid card; and children who have applied for Medicaid or [place] Health Choice, but were declined, whose parents can pay a reduced fee at time of service.  °[facility] [place]  [address], [place] (336) 641-7733 Accepts children up to age 21 who are enrolled in Medicaid or [place] Health Choice; pregnant women with a Medicaid card; and children who have applied for Medicaid or [place] Health Choice,  but were declined, whose parents can pay a reduced fee at time of service.  °[facility] ° [address], [place] (336) 641-4533 Patients are seen by appointment only. Walk-ins are not accepted. [facility] will see patients 18 years of age and older. °Monday - Tuesday (8am-5pm) °Most Wednesdays (8:30-5pm) °$30 per visit, cash only  °[facility] ° [address], [place] (336) 641-4533 Patients are seen by appointment only. Walk-ins are not accepted. [facility] will see patients 18 years of age and older. °One Wednesday Evening (Monthly: Volunteer Based).  $30 per visit, cash only  °[facility]  (919) 537-3737 for adults; Children under age 4, call Graduate Pediatric Dentistry at (919) [phone]. Children aged 4-14, please call (919) [phone] to request a pediatric application. ° Dental services are provided in all areas of dental care including fillings, crowns and bridges, complete and partial dentures, implants, gum treatment, root canals, and extractions. Preventive care is also provided. Treatment is provided to both adults and children. °Patients are selected via a lottery and there is often a waiting list. °  °[facility] [address], °[place] ° (336) 763-8833 [email or web address] °  °[facility] [address], [place], [place] (336)723-1848, Ext. 123 Second and Fourth Thursday of each month, opens at 6:30 AM; Clinic ends at 9 AM.  Patients are seen on a first-come first-served basis, and a limited number are seen during each clinic.  ° °[facility] ° [address], [place], [place] (336) 723-7904   Eligibility Requirements °You must have lived in [place], [place], or [place] counties for at least the last three months. °  You cannot be eligible for state or federal sponsored healthcare insurance, including Veterans Administration, Medicaid, or Medicare. °  You generally  cannot be eligible for healthcare insurance through your employer.  °  How to apply: °Eligibility screenings are held every Tuesday and Wednesday afternoon from 1:00 pm until 4:00 pm. You do not need an appointment for the interview!  °[facility] [address], [place], [place] 336-631-2330   °  [place] [facility]  336-342-8273   °[facility]  336-703-3100   °[facility]  336-570-6415   ° °Behavioral Health Resources in the Community: °Intensive Outpatient Programs °Organization         Address  Phone  Notes  °[facility] [address], [place], [place] 336-878-6098   °[facility] [address], [place], [place] 336-832-9800   °[facility] [address], [place], [place] ° 336-882-2125   °[facility] [address],  °[place], [place] 1-800-853-5163 or 336-641-4981   °Substance Abuse Resources °Organization         Address  Phone  Notes  °[facility]  336-882-2125   °[facility]  336-784-9470   °[facility]  336-285-9073   °[facility]  336-845-3988   °Residential & Outpatient Substance Abuse Program  1-800-659-3381   °Psychological Services °Organization         Address  Phone  Notes  °[facility]  336- 832-9600   °[facility]  336- 378-7881   °[facility] [address], [place] 1-800-853-5163 or 336-641-4981   ° °Mobile Crisis Teams °Organization         Address  Phone  Notes  °[facility], Mobile Crisis Care Unit  1-877-626-1772   °Assertive °[facility] ° [address] [place], [place] 336-834-9664   °Sharon DeEsch [address] °[place] 336-554-5454   ° °Self-Help/Support Groups °Organization         Address  Phone             Notes  °[facility] - variety of support groups  336- 373-1402 Call for more  information  °Narcotics Anonymous (NA), [facility] [address], °[place]  2 meetings at this location  ° °Residential Treatment Programs °Organization         Address  Phone  Notes  °[facility] [address],    °[place]  1-866-801-8205   °[facility] ° [address], [place], [place] 704-293-8524   °[facility] [address], [place] 336-845-3988 Admissions: 8am-3pm M-F  °[facility] [address]    °[place], [place] 336-841-1104   °[facility] [address], [place], [place] 336-379-7146   °[facility] [address]  °[place], [place] 336-285-9073   °[facility] - Intensive Outpatient [address], [place], [place] 336-852-3033   °[facility] [address]  °[place], [place] 1-877-615-2722 or 336-784-9470   °[facility] [address] [place], [place] 336-227-7417 Accepts Medicaid  °[facility] [address]  °[place] 1-800-659-3381 Substance Abuse/Addiction Treatment  ° °[place] Behavioral Health Resources °Organization         Address  Phone  Notes  °[facility]  (888) 581-9988   °Julie Brannon, PhD [address] [place], [place]   (336) 349-5553 or (336) 951-0000   °[facility]   [address] °[place],  (336) 349-4454   °[facility] [address], [place],  (336) 342-8316 Insurance/Medicaid/sponsorship through [facility]  °[facility] [address]                                      [place], [place] (336) 342-8316 Therapy/tele-psych/case  °[facility] [address]  ° [place], [place] (336) 349-2233    °Dr. Arfeen  (336) 349-4544   °[facility] [place]  [employer] [place] [facility] 1) [address], [place] °2) [address], [place] °3)  [address], [place] (336)  349-3220 °(336) 342-7768 ° °(336) 342-8140   °[place] [facility] (336) 342-1394 or (336) 342-3537 (After Hours)    ° ° °

## 2014-07-31 NOTE — ED Notes (Signed)
C/o sinus infection since Monday. Now has h/a and fever at home of 102. Chills. C/o stiffness in neck earlier but none now.

## 2014-08-02 LAB — CULTURE, GROUP A STREP

## 2016-06-30 ENCOUNTER — Emergency Department (HOSPITAL_BASED_OUTPATIENT_CLINIC_OR_DEPARTMENT_OTHER)
Admission: EM | Admit: 2016-06-30 | Discharge: 2016-06-30 | Disposition: A | Discharge status: Home / Self Care | Payer: 59 | Attending: Emergency Medicine | Admitting: Emergency Medicine

## 2016-06-30 ENCOUNTER — Encounter (HOSPITAL_BASED_OUTPATIENT_CLINIC_OR_DEPARTMENT_OTHER): Payer: Self-pay

## 2016-06-30 DIAGNOSIS — M25512 Pain in left shoulder: Secondary | ICD-10-CM | POA: Diagnosis not present

## 2016-06-30 DIAGNOSIS — Z79899 Other long term (current) drug therapy: Secondary | ICD-10-CM | POA: Insufficient documentation

## 2016-06-30 DIAGNOSIS — Y9389 Activity, other specified: Secondary | ICD-10-CM | POA: Diagnosis not present

## 2016-06-30 DIAGNOSIS — Z87891 Personal history of nicotine dependence: Secondary | ICD-10-CM | POA: Diagnosis not present

## 2016-06-30 DIAGNOSIS — Y999 Unspecified external cause status: Secondary | ICD-10-CM | POA: Insufficient documentation

## 2016-06-30 DIAGNOSIS — Y92410 Unspecified street and highway as the place of occurrence of the external cause: Secondary | ICD-10-CM | POA: Insufficient documentation

## 2016-06-30 DIAGNOSIS — M542 Cervicalgia: Secondary | ICD-10-CM | POA: Diagnosis present

## 2016-06-30 MED ORDER — KETOROLAC TROMETHAMINE 60 MG/2ML IM SOLN
60.0000 mg | Freq: Once | INTRAMUSCULAR | Status: AC
  Administered 2016-06-30: 60 mg via INTRAMUSCULAR
  Filled 2016-06-30: qty 2
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
## 2016-07-03 ENCOUNTER — Encounter (HOSPITAL_BASED_OUTPATIENT_CLINIC_OR_DEPARTMENT_OTHER): Payer: Self-pay | Admitting: *Deleted

## 2016-07-03 ENCOUNTER — Emergency Department (HOSPITAL_BASED_OUTPATIENT_CLINIC_OR_DEPARTMENT_OTHER)
Admission: EM | Admit: 2016-07-03 | Discharge: 2016-07-03 | Disposition: A | Discharge status: Home / Self Care | Payer: 59 | Attending: Emergency Medicine | Admitting: Emergency Medicine

## 2016-07-03 ENCOUNTER — Emergency Department (HOSPITAL_BASED_OUTPATIENT_CLINIC_OR_DEPARTMENT_OTHER): Payer: 59

## 2016-07-03 DIAGNOSIS — Y9389 Activity, other specified: Secondary | ICD-10-CM | POA: Insufficient documentation

## 2016-07-03 DIAGNOSIS — J45909 Unspecified asthma, uncomplicated: Secondary | ICD-10-CM | POA: Insufficient documentation

## 2016-07-03 DIAGNOSIS — Y999 Unspecified external cause status: Secondary | ICD-10-CM | POA: Insufficient documentation

## 2016-07-03 DIAGNOSIS — Z79899 Other long term (current) drug therapy: Secondary | ICD-10-CM | POA: Diagnosis not present

## 2016-07-03 DIAGNOSIS — S161XXA Strain of muscle, fascia and tendon at neck level, initial encounter: Secondary | ICD-10-CM | POA: Insufficient documentation

## 2016-07-03 DIAGNOSIS — Y92410 Unspecified street and highway as the place of occurrence of the external cause: Secondary | ICD-10-CM | POA: Diagnosis not present

## 2016-07-03 DIAGNOSIS — S161XXD Strain of muscle, fascia and tendon at neck level, subsequent encounter: Secondary | ICD-10-CM

## 2016-07-03 DIAGNOSIS — Z87891 Personal history of nicotine dependence: Secondary | ICD-10-CM | POA: Diagnosis not present

## 2016-07-03 DIAGNOSIS — S199XXA Unspecified injury of neck, initial encounter: Secondary | ICD-10-CM | POA: Diagnosis present

## 2016-07-03 HISTORY — DX: Unspecified asthma, uncomplicated: J45.909

## 2016-07-03 MED ORDER — CYCLOBENZAPRINE HCL 10 MG PO TABS: 10.0000 mg | ORAL_TABLET | Freq: Two times a day (BID) | ORAL | 0 refills | Status: AC | PRN
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
## 2016-07-03 NOTE — ED Notes (Signed)
MD at bedside. 
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
## 2016-07-03 NOTE — Discharge Instructions (Signed)
Ibuprofen 600 mg every 6 hours as needed for pain.  Flexeril as prescribed as needed for pain not relieved with ibuprofen.  Follow-up with your primary Dr. if not improving in the next week, or if your symptoms worsen or change.

## 2016-09-01 ENCOUNTER — Ambulatory Visit: Payer: 59 | Attending: Internal Medicine | Admitting: Physical Therapy

## 2016-09-01 ENCOUNTER — Encounter: Payer: Self-pay | Admitting: Physical Therapy

## 2016-09-01 DIAGNOSIS — M545 Low back pain, unspecified: Secondary | ICD-10-CM

## 2016-09-01 DIAGNOSIS — M62830 Muscle spasm of back: Secondary | ICD-10-CM | POA: Insufficient documentation

## 2016-09-01 DIAGNOSIS — M542 Cervicalgia: Secondary | ICD-10-CM | POA: Insufficient documentation
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
## 2016-09-07 ENCOUNTER — Ambulatory Visit: Payer: Self-pay | Attending: Internal Medicine | Admitting: Physical Therapy

## 2016-09-07 DIAGNOSIS — M542 Cervicalgia: Secondary | ICD-10-CM | POA: Insufficient documentation

## 2016-09-07 DIAGNOSIS — M62830 Muscle spasm of back: Secondary | ICD-10-CM | POA: Insufficient documentation

## 2016-09-07 DIAGNOSIS — M545 Low back pain: Secondary | ICD-10-CM | POA: Insufficient documentation

## 2016-09-08 ENCOUNTER — Ambulatory Visit: Payer: Self-pay | Admitting: Physical Therapy

## 2016-09-08 ENCOUNTER — Encounter: Payer: Self-pay | Admitting: Physical Therapy

## 2016-09-08 DIAGNOSIS — M545 Low back pain, unspecified: Secondary | ICD-10-CM

## 2016-09-08 DIAGNOSIS — M62830 Muscle spasm of back: Secondary | ICD-10-CM

## 2016-09-08 NOTE — Therapy (Signed)
[facility] [address] [place], [place], [address] Phone: [phone]   Fax:  [phone]  Physical Therapy Treatment  Patient Details  Name: Seth Cook MRN: [id number] Date of Birth: [birth year] Referring Provider: Dr. Julio Sickssei-Bonsu  Encounter Date: 09/08/2016      PT End of Session - 09/08/16 1640    Visit Number 2   Date for PT Re-Evaluation 10/27/16   PT Start Time 1602   PT Stop Time 1657   PT Time Calculation (min) 55 min   Activity Tolerance Patient tolerated treatment well   Behavior During Therapy [facility] for tasks assessed/performed      Past Medical History:  Diagnosis Date  . Asthma     Past Surgical History:  Procedure Laterality Date  . HERNIA REPAIR      There were no vitals filed for this visit.      Subjective Assessment - 09/08/16 1604    Subjective "Doing pretty good today"   Currently in Pain? Yes   Pain Score 4    Pain Location Back   Pain Orientation Lower                         [facility] Adult PT Treatment/Exercise - 09/08/16 0001      Exercises   Exercises Lumbar;Knee/Hip     Lumbar Exercises: Machines for Strengthening   Cybex Knee Flexion 35lb 2x10   Other Lumbar Machine Exercise Seated Rows and Lats 25lb 2x10      Lumbar Exercises: Standing   Shoulder Extension 15 reps;Theraband  x2   Theraband Level (Shoulder Extension) Level 3 (Green)     Lumbar Exercises: Seated   Sit to Stand 10 reps  No UE, 2 sets    Other Seated Lumbar Exercises Seated Tband Rows green 2x15      Knee/Hip Exercises: Aerobic   Nustep L4 x 6min      Modalities   Modalities Electrical Stimulation;Moist Heat     Moist Heat Therapy   Number Minutes Moist Heat 15 Minutes   Moist Heat Location Lumbar Spine;Cervical     Electrical Stimulation   Electrical Stimulation Location lumbar region   Electrical Stimulation Action IFC   Electrical Stimulation Goals Pain                   PT Short Term Goals - 09/01/16 1614      PT SHORT TERM GOAL #1   Title Pt will demonstrate proper recall with HEP   Time 1   Period Weeks   Status New           PT Long Term Goals - 09/01/16 1615      PT LONG TERM GOAL #1   Title Pt will present with Lumbar AROM wfl without increasing symptoms.   Time 8   Period Weeks   Status New     PT LONG TERM GOAL #2   Title Pt will be able to sit through a 50 minute class with <2/10 pain in order to move toward normal functional activities.   Time 8   Period Weeks   Status New     PT LONG TERM GOAL #3   Title Pt will report 50% (worst) symptom decrease in order to perform daily activities wiht less difficulty.   Time 8   Period Weeks   Status New  Plan - 09/08/16 1641    Clinical Impression Statement Pt tolerated an initial progression to ther ex well. Pt reports a improvement with mobility since evaluation. During today's interventions pt does report some low back tightness with seated HS curls and sit to stand. Despite complaints pt able to push through.   Rehab Potential Fair   PT Frequency 2x / week   PT Duration 8 weeks   PT Treatment/Interventions Energy conservation;Passive range of motion;Manual techniques;Therapeutic activities;Therapeutic exercise;Balance training;Neuromuscular re-education;Patient/family education;Functional mobility training;Stair training;Gait training;Ultrasound;Moist Heat;Traction;Electrical Stimulation;Iontophoresis 4mg /ml Dexamethasone;ADLs/Self Care Home Management;Cryotherapy   PT Next Visit Plan assess Tx. TA activation, PROM, aerobic exercise, gentle movement      Patient will benefit from skilled therapeutic intervention in order to improve the following deficits and impairments:  Abnormal gait, Decreased activity tolerance, Decreased mobility, Decreased endurance, Decreased coordination, Decreased range of motion, Decreased strength, Pain,  Improper body mechanics, Postural dysfunction, Hypomobility, Impaired flexibility  Visit Diagnosis: Muscle spasm of back  Acute bilateral low back pain without sciatica     Problem List There are no active problems to display for this patient.   Grayce Sessionsonald G Owenn Rothermel, PTA 09/08/2016, 4:44 PM  [facility] [address] [place], [place], [address] Phone: [phone]   Fax:  [phone]  Name: Seth Cook MRN: [id number] Date of Birth: 05-10-1976

## 2016-09-14 ENCOUNTER — Ambulatory Visit: Payer: Self-pay | Admitting: Physical Therapy

## 2016-09-14 ENCOUNTER — Encounter: Payer: Self-pay | Admitting: Physical Therapy

## 2016-09-14 DIAGNOSIS — M545 Low back pain, unspecified: Secondary | ICD-10-CM

## 2016-09-14 DIAGNOSIS — M542 Cervicalgia: Secondary | ICD-10-CM

## 2016-09-14 DIAGNOSIS — M62830 Muscle spasm of back: Secondary | ICD-10-CM

## 2016-09-14 NOTE — Therapy (Signed)
[facility] [address] [place], [place], [address] Phone: [phone]   Fax:  [phone]  Physical Therapy Treatment  Patient Details  Name: Katherene PontoStephen Ferryman MRN: [id number] Date of Birth: 11/22/1975 Referring Provider: Dr. Julio Sickssei-Bonsu  Encounter Date: 09/14/2016      PT End of Session - 09/14/16 1645    Visit Number 3   Date for PT Re-Evaluation 10/27/16   PT Start Time 1559   PT Stop Time 1657   PT Time Calculation (min) 58 min      Past Medical History:  Diagnosis Date  . Asthma     Past Surgical History:  Procedure Laterality Date  . HERNIA REPAIR      There were no vitals filed for this visit.      Subjective Assessment - 09/14/16 1557    Subjective "Doing pretty good, the drive over here gets me."   Currently in Pain? Yes   Pain Score 6    Pain Location Back   Pain Orientation Lower                         [facility] Adult PT Treatment/Exercise - 09/14/16 0001      Lumbar Exercises: Machines for Strengthening   Cybex Knee Extension 15lb 2x10   Cybex Knee Flexion 35lb 2x10   Other Lumbar Machine Exercise Seated Rows and Lats 35lb 2x10      Lumbar Exercises: Standing   Shoulder Extension 15 reps;Theraband  x2   Theraband Level (Shoulder Extension) Level 3 (Green)   Other Standing Lumbar Exercises Standing trunk rotations with yellow ball 2x10     Lumbar Exercises: Seated   Sit to Stand 10 reps  holding yellow ball x2      Knee/Hip Exercises: Aerobic   Nustep L4 x 6min      Modalities   Modalities Electrical Stimulation;Moist Heat     Moist Heat Therapy   Number Minutes Moist Heat 15 Minutes   Moist Heat Location Lumbar Spine;Cervical     Electrical Stimulation   Electrical Stimulation Location lumbar region   Electrical Stimulation Action IFC   Electrical Stimulation Goals Pain                  PT Short Term Goals - 09/01/16 1614      PT SHORT TERM  GOAL #1   Title Pt will demonstrate proper recall with HEP   Time 1   Period Weeks   Status New           PT Long Term Goals - 09/14/16 1648      PT LONG TERM GOAL #1   Title Pt will present with Lumbar AROM wfl without increasing symptoms.   Status On-going     PT LONG TERM GOAL #2   Title Pt will be able to sit through a 50 minute class with <2/10 pain in order to move toward normal functional activities.   Status On-going     PT LONG TERM GOAL #3   Title Pt will report 50% (worst) symptom decrease in order to perform daily activities wiht less difficulty.   Status On-going               Plan - 09/14/16 1646    Clinical Impression Statement Pt reports low back pain with prolong sitting and sleeping. At empted some LE stretches but unable to tolerate due to low back pain. Tolerated additional resistance  with seated Row & Lats. No reports of LBP with HS curls.   Rehab Potential Fair   PT Frequency 2x / week   PT Duration 8 weeks   PT Treatment/Interventions Energy conservation;Passive range of motion;Manual techniques;Therapeutic activities;Therapeutic exercise;Balance training;Neuromuscular re-education;Patient/family education;Functional mobility training;Stair training;Gait training;Ultrasound;Moist Heat;Traction;Electrical Stimulation;Iontophoresis 4mg /ml Dexamethasone;ADLs/Self Care Home Management;Cryotherapy   PT Next Visit Plan assess Tx. TA activation, PROM, aerobic exercise, gentle movement      Patient will benefit from skilled therapeutic intervention in order to improve the following deficits and impairments:  Abnormal gait, Decreased activity tolerance, Decreased mobility, Decreased endurance, Decreased coordination, Decreased range of motion, Decreased strength, Pain, Improper body mechanics, Postural dysfunction, Hypomobility, Impaired flexibility  Visit Diagnosis: Muscle spasm of back  Acute bilateral low back pain without  sciatica  Cervicalgia     Problem List There are no active problems to display for this patient.   Grayce SessionsRonald G Tyr Franca 09/14/2016, 4:49 PM  [facility] [address] [place], [place], [address] Phone: [phone]   Fax:  [phone]  Name: Katherene PontoStephen Schloemer MRN: [id number] Date of Birth: Feb 07, 1976

## 2016-09-16 ENCOUNTER — Ambulatory Visit: Payer: Self-pay | Admitting: Physical Therapy

## 2016-09-20 ENCOUNTER — Encounter: Payer: Self-pay | Admitting: Physical Therapy

## 2016-09-20 ENCOUNTER — Ambulatory Visit: Payer: Self-pay | Admitting: Physical Therapy

## 2016-09-20 DIAGNOSIS — M542 Cervicalgia: Secondary | ICD-10-CM

## 2016-09-20 DIAGNOSIS — M545 Low back pain, unspecified: Secondary | ICD-10-CM

## 2016-09-20 DIAGNOSIS — M62830 Muscle spasm of back: Secondary | ICD-10-CM

## 2016-09-20 NOTE — Therapy (Signed)
[facility] [address] [place], [place], [address] Phone: [phone]   Fax:  [phone]  Physical Therapy Treatment  Patient Details  Name: Seth Cook MRN: [id number] Date of Birth: 1976/04/23 Referring Provider: Dr. Julio Sickssei-Bonsu  Encounter Date: 09/20/2016      PT End of Session - 09/20/16 1226    Visit Number 4   Date for PT Re-Evaluation 10/27/16   PT Start Time 1150   PT Stop Time 1240   PT Time Calculation (min) 50 min   Activity Tolerance Patient tolerated treatment well   Behavior During Therapy [facility] for tasks assessed/performed      Past Medical History:  Diagnosis Date  . Asthma     Past Surgical History:  Procedure Laterality Date  . HERNIA REPAIR      There were no vitals filed for this visit.      Subjective Assessment - 09/20/16 1150    Subjective "The drive here gets me"   Currently in Pain? Yes   Pain Score 6    Pain Location Back   Pain Orientation Lower                         [facility] Adult PT Treatment/Exercise - 09/20/16 0001      Lumbar Exercises: Machines for Strengthening   Cybex Knee Extension 15lb 2x10   Cybex Knee Flexion 45lb 2x10   Leg Press 40lb 2x15   Other Lumbar Machine Exercise Seated Rows and Lats 45lb 2x10      Lumbar Exercises: Standing   Other Standing Lumbar Exercises Standing trunk rotations with yellow ball 2x10     Knee/Hip Exercises: Aerobic   Nustep L6 x 7 min      Modalities   Modalities Electrical Stimulation;Moist Heat     Moist Heat Therapy   Number Minutes Moist Heat 15 Minutes   Moist Heat Location Lumbar Spine;Cervical     Electrical Stimulation   Electrical Stimulation Location lumbar region   Electrical Stimulation Action IFC   Electrical Stimulation Goals Pain                  PT Short Term Goals - 09/20/16 1227      PT SHORT TERM GOAL #1   Title Pt will demonstrate proper recall with HEP   Status Achieved           PT Long Term Goals - 09/20/16 1227      PT LONG TERM GOAL #1   Title Pt will present with Lumbar AROM wfl without increasing symptoms.   Status On-going     PT LONG TERM GOAL #2   Title Pt will be able to sit through a 50 minute class with <2/10 pain in order to move toward normal functional activities.   Status On-going     PT LONG TERM GOAL #3   Title Pt will report 50% (worst) symptom decrease in order to perform daily activities wiht less difficulty.   Status On-going               Plan - 09/20/16 1226    Clinical Impression Statement Pt ~ 5 min late for today's session. He continues to only report pain when driving and when doing certain movements. Bo issues with today's activities. Pt reports that he is not in school at the time sitting in class.   Rehab Potential Fair   PT Frequency 2x / week  PT Duration 8 weeks   PT Treatment/Interventions Energy conservation;Passive range of motion;Manual techniques;Therapeutic activities;Therapeutic exercise;Balance training;Neuromuscular re-education;Patient/family education;Functional mobility training;Stair training;Gait training;Ultrasound;Moist Heat;Traction;Electrical Stimulation;Iontophoresis 4mg /ml Dexamethasone;ADLs/Self Care Home Management;Cryotherapy   PT Next Visit Plan assess Tx. TA activation, PROM, aerobic exercise, gentle movement      Patient will benefit from skilled therapeutic intervention in order to improve the following deficits and impairments:  Abnormal gait, Decreased activity tolerance, Decreased mobility, Decreased endurance, Decreased coordination, Decreased range of motion, Decreased strength, Pain, Improper body mechanics, Postural dysfunction, Hypomobility, Impaired flexibility  Visit Diagnosis: Cervicalgia  Acute bilateral low back pain without sciatica  Muscle spasm of back     Problem List There are no active problems to display for this patient.   Grayce Sessionsonald  G Ayn Domangue, PTA 09/20/2016, 12:28 PM  [facility] [address] [place], [place], [address] Phone: [phone]   Fax:  [phone]  Name: Seth Cook MRN: [id number] Date of Birth: 28-Sep-1976

## 2016-09-28 ENCOUNTER — Ambulatory Visit: Payer: Self-pay | Admitting: Physical Therapy

## 2016-09-28 ENCOUNTER — Encounter: Payer: Self-pay | Admitting: Physical Therapy

## 2016-09-28 DIAGNOSIS — M542 Cervicalgia: Secondary | ICD-10-CM

## 2016-09-28 DIAGNOSIS — M545 Low back pain, unspecified: Secondary | ICD-10-CM

## 2016-09-28 DIAGNOSIS — M62830 Muscle spasm of back: Secondary | ICD-10-CM
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
## 2016-09-30 ENCOUNTER — Ambulatory Visit: Payer: 59 | Admitting: Physical Therapy

## 2016-09-30 ENCOUNTER — Encounter: Payer: Self-pay | Admitting: Physical Therapy

## 2016-09-30 DIAGNOSIS — M542 Cervicalgia: Secondary | ICD-10-CM

## 2016-09-30 DIAGNOSIS — M62830 Muscle spasm of back: Secondary | ICD-10-CM

## 2016-09-30 DIAGNOSIS — M545 Low back pain, unspecified: Secondary | ICD-10-CM

## 2016-09-30 NOTE — Therapy (Signed)
[place], [place], [address] Phone: [phone]   Fax:  [phone]  Physical Therapy Treatment  Patient Details  Name: Seth Cook MRN: [id number] Date of Birth: 1976-03-06 Referring Provider: Dr. Vista Lawman  Encounter Date: 09/30/2016      PT End of Session - 09/30/16 1143    Visit Number 6   Date for PT Re-Evaluation 10/27/16   PT Start Time 1100   PT Stop Time 1153   PT Time Calculation (min) 53 min      Past Medical History:  Diagnosis Date  . Asthma     Past Surgical History:  Procedure Laterality Date  . HERNIA REPAIR      There were no vitals filed for this visit.      Subjective Assessment - 09/30/16 1104    Subjective "Today isn't a good day, I woke up this morning my back feels strained."   Currently in Pain? Yes   Pain Score 7    Pain Location Back                         [facility] Adult PT Treatment/Exercise - 09/30/16 0001      Lumbar Exercises: Stretches   Passive Hamstring Stretch 10 seconds;4 reps   Piriformis Stretch 3 reps;10 seconds     Lumbar Exercises: Aerobic   Elliptical I10 R5 55fd/2rev      Lumbar Exercises: Machines for Strengthening   Other Lumbar Machine Exercise Seated Rows and Lats 45lb 2x15     Lumbar Exercises: Supine   Bridge 15 reps;3 seconds  x2   Straight Leg Raise 15 reps;2 seconds     Knee/Hip Exercises: Standing   Hip Abduction 2 sets;10 reps;Knee straight   Abduction Limitations 5   Hip Extension 2 sets;Both;10 reps;Knee straight   Extension Limitations 5     Modalities   Modalities Electrical Stimulation;Moist Heat     Moist Heat Therapy   Number Minutes Moist Heat 156 Minutes   Moist Heat Location Lumbar Spine     Electrical Stimulation   Electrical Stimulation Location lumbar region   Electrical Stimulation Action IFC   Electrical Stimulation Goals Pain                  PT Short  Term Goals - 09/20/16 1227      PT SHORT TERM GOAL #1   Title Pt will demonstrate proper recall with HEP   Status Achieved           PT Long Term Goals - 09/28/16 1011      PT LONG TERM GOAL #1   Title Pt will present with Lumbar AROM wfl without increasing symptoms.   Status On-going     PT LONG TERM GOAL #2   Title Pt will be able to sit through a 50 minute class with <2/10 pain in order to move toward normal functional activities.   Status On-going     PT LONG TERM GOAL #3   Title Pt will report 50% (worst) symptom decrease in order to perform daily activities wiht less difficulty.   Status Partially Met               Plan - 09/30/16 1144    Clinical Impression Statement Pt enters clinic reporting increase LBP. Pt with some improvement after warm up and stretching. Pt tolerated all exercises well despite LBP.    PT Frequency 1x /  week   PT Duration 8 weeks   PT Treatment/Interventions Energy conservation;Passive range of motion;Manual techniques;Therapeutic activities;Therapeutic exercise;Balance training;Neuromuscular re-education;Patient/family education;Functional mobility training;Stair training;Gait training;Ultrasound;Moist Heat;Traction;Electrical Stimulation;Iontophoresis 60m/ml Dexamethasone;ADLs/Self Care Home Management;Cryotherapy   PT Next Visit Plan Spoke with Lead PT about decreasing to 1x / week. adviised pt to return to his PCP      Patient will benefit from skilled therapeutic intervention in order to improve the following deficits and impairments:  Abnormal gait, Decreased activity tolerance, Decreased mobility, Decreased endurance, Decreased coordination, Decreased range of motion, Decreased strength, Pain, Improper body mechanics, Postural dysfunction, Hypomobility, Impaired flexibility  Visit Diagnosis: Muscle spasm of back  Cervicalgia  Acute bilateral low back pain without sciatica     Problem List There are no active problems to  display for this patient.   RScot Jun12/28/2017, 11:46 AM  [place] [address]Phone: [phone]  Fax:  [phone] Name: Seth NoahMRN: [id number] of Birth: 214-Mar-1977

## 2016-10-07 ENCOUNTER — Encounter: Payer: Self-pay | Admitting: Physical Therapy

## 2016-10-07 ENCOUNTER — Ambulatory Visit: Payer: PRIVATE HEALTH INSURANCE | Attending: Internal Medicine | Admitting: Physical Therapy

## 2016-10-07 DIAGNOSIS — M62830 Muscle spasm of back: Secondary | ICD-10-CM | POA: Insufficient documentation

## 2016-10-07 DIAGNOSIS — M545 Low back pain, unspecified: Secondary | ICD-10-CM

## 2016-10-07 NOTE — Therapy (Signed)
[place], [place], [address] Phone: [phone]   Fax:  [phone]  Physical Therapy Treatment  Patient Details  Name: Seth Cook MRN: [id number] Date of Birth: [birth year] Referring Provider: Dr. Vista Lawman  Encounter Date: 10/07/2016      PT End of Session - 10/07/16 1210    Visit Number 7   Date for PT Re-Evaluation 10/27/16   PT Start Time 1151   PT Stop Time 1220   PT Time Calculation (min) 29 min   Activity Tolerance Patient tolerated treatment well   Behavior During Therapy [facility] for tasks assessed/performed      Past Medical History:  Diagnosis Date  . Asthma     Past Surgical History:  Procedure Laterality Date  . HERNIA REPAIR      There were no vitals filed for this visit.      Subjective Assessment - 10/07/16 1151    Subjective "When Im walking regularly then it ok, but when Im sitting and sleeping I have pain"   Currently in Pain? Yes   Pain Score 6    Pain Location Back   Pain Orientation Lower                         [facility] Adult PT Treatment/Exercise - 10/07/16 0001      Lumbar Exercises: Aerobic   Stationary Bike NuStep L6 x6 min     Modalities   Modalities Traction     Traction   Type of Traction Lumbar   Hold Time 95   Rest Time 0   Time 15                  PT Short Term Goals - 09/20/16 1227      PT SHORT TERM GOAL #1   Title Pt will demonstrate proper recall with HEP   Status Achieved           PT Long Term Goals - 10/07/16 1213      PT LONG TERM GOAL #1   Title Pt will present with Lumbar AROM wfl without increasing symptoms.   Status On-going     PT LONG TERM GOAL #2   Title Pt will be able to sit through a 50 minute class with <2/10 pain in order to move toward normal functional activities.   Status On-going     PT LONG TERM GOAL #3   Title Pt will report 50% (worst) symptom decrease in order to  perform daily activities wiht less difficulty.   Status Partially Met               Plan - 10/07/16 1211    Clinical Impression Statement Attempted lumbar traction after a light full body warm up. Cues needed for pt to relax during traction. No reports of increase pain just a pressure sensation during traction.   Rehab Potential Fair   PT Frequency 1x / week   PT Duration 8 weeks   PT Treatment/Interventions Energy conservation;Passive range of motion;Manual techniques;Therapeutic activities;Therapeutic exercise;Balance training;Neuromuscular re-education;Patient/family education;Functional mobility training;Stair training;Gait training;Ultrasound;Moist Heat;Traction;Electrical Stimulation;Iontophoresis 31m/ml Dexamethasone;ADLs/Self Care Home Management;Cryotherapy   PT Next Visit Plan assess traction. Pt reports that he did not schedule an appointment with his PCP due to the holidays      Patient will benefit from skilled therapeutic intervention in order to improve the following deficits and impairments:  Abnormal gait, Decreased activity tolerance, Decreased mobility,  Decreased endurance, Decreased coordination, Decreased range of motion, Decreased strength, Pain, Improper body mechanics, Postural dysfunction, Hypomobility, Impaired flexibility  Visit Diagnosis: Muscle spasm of back  Acute bilateral low back pain without sciatica     Problem List There are no active problems to display for this patient.   Scot Jun, PTA 10/07/2016, 12:14 PM  [place], [place], [address] Phone: [phone]   Fax:  [phone]  Name: Gunnison Chahal MRN: [id number] Date of Birth: 12/10/1975

## 2016-10-14 ENCOUNTER — Encounter: Payer: Self-pay | Admitting: Physical Therapy

## 2016-10-14 ENCOUNTER — Ambulatory Visit: Payer: PRIVATE HEALTH INSURANCE | Admitting: Physical Therapy

## 2016-10-14 DIAGNOSIS — M545 Low back pain, unspecified: Secondary | ICD-10-CM

## 2016-10-14 DIAGNOSIS — M62830 Muscle spasm of back: Secondary | ICD-10-CM
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
## 2016-10-28 ENCOUNTER — Ambulatory Visit: Payer: PRIVATE HEALTH INSURANCE | Admitting: Physical Therapy

## 2017-04-21 ENCOUNTER — Other Ambulatory Visit: Payer: Self-pay | Admitting: Rehabilitation

## 2017-04-21 DIAGNOSIS — M542 Cervicalgia: Secondary | ICD-10-CM

## 2017-05-03 ENCOUNTER — Ambulatory Visit
Admission: RE | Admit: 2017-05-03 | Discharge: 2017-05-03 | Disposition: A | Discharge status: Home / Self Care | Payer: PRIVATE HEALTH INSURANCE | Source: Ambulatory Visit | Attending: Rehabilitation | Admitting: Rehabilitation

## 2017-05-03 DIAGNOSIS — M542 Cervicalgia: Secondary | ICD-10-CM
